# Patient Record
Sex: Female | Born: 1990 | Race: White | Marital: Married
Health system: Southern US, Community
[De-identification: ages and names within clinical notes are randomized; demographics above are authoritative.]

---

## 2012-10-02 ENCOUNTER — Other Ambulatory Visit: Payer: Self-pay | Admitting: Neurosurgery

## 2012-10-02 DIAGNOSIS — E348 Other specified endocrine disorders: Secondary | ICD-10-CM

## 2012-11-11 ENCOUNTER — Other Ambulatory Visit: Payer: Self-pay | Admitting: Neurosurgery

## 2012-11-11 ENCOUNTER — Ambulatory Visit
Admission: RE | Admit: 2012-11-11 | Discharge: 2012-11-11 | Disposition: A | Discharge status: Home / Self Care | Payer: BC Managed Care – PPO | Source: Ambulatory Visit | Attending: Neurosurgery | Admitting: Neurosurgery

## 2012-11-11 DIAGNOSIS — E348 Other specified endocrine disorders: Secondary | ICD-10-CM

## 2014-03-12 ENCOUNTER — Other Ambulatory Visit: Payer: Self-pay | Admitting: Gastroenterology

## 2014-03-12 DIAGNOSIS — R1013 Epigastric pain: Secondary | ICD-10-CM

## 2014-04-02 ENCOUNTER — Ambulatory Visit (HOSPITAL_COMMUNITY)
Admission: RE | Admit: 2014-04-02 | Discharge: 2014-04-02 | Disposition: A | Discharge status: Home / Self Care | Payer: BLUE CROSS/BLUE SHIELD | Source: Ambulatory Visit | Attending: Gastroenterology | Admitting: Gastroenterology

## 2014-04-02 ENCOUNTER — Encounter (HOSPITAL_COMMUNITY)
Admission: RE | Admit: 2014-04-02 | Discharge: 2014-04-02 | Disposition: A | Discharge status: Home / Self Care | Payer: BLUE CROSS/BLUE SHIELD | Source: Ambulatory Visit | Attending: Gastroenterology | Admitting: Gastroenterology

## 2014-04-02 DIAGNOSIS — R1013 Epigastric pain: Secondary | ICD-10-CM | POA: Insufficient documentation

## 2014-04-03 ENCOUNTER — Encounter (HOSPITAL_COMMUNITY)
Admission: RE | Admit: 2014-04-03 | Discharge: 2014-04-03 | Disposition: A | Discharge status: Home / Self Care | Payer: BLUE CROSS/BLUE SHIELD | Source: Ambulatory Visit | Attending: Gastroenterology | Admitting: Gastroenterology

## 2014-04-03 DIAGNOSIS — R1013 Epigastric pain: Secondary | ICD-10-CM | POA: Insufficient documentation

## 2014-04-03 MED ORDER — SINCALIDE 5 MCG IJ SOLR
0.0200 ug/kg | Freq: Once | INTRAMUSCULAR | Status: AC
  Administered 2014-04-03: 1.1 ug via INTRAVENOUS

## 2014-04-03 MED ORDER — TECHNETIUM TC 99M MEBROFENIN IV KIT: 4.9000 | PACK | Freq: Once | INTRAVENOUS | Status: AC | PRN

## 2014-04-03 MED ORDER — TECHNETIUM TC 99M MEBROFENIN IV KIT
4.9000 | PACK | Freq: Once | INTRAVENOUS | Status: AC | PRN
  Administered 2014-04-03: 5 via INTRAVENOUS

## 2014-08-06 ENCOUNTER — Emergency Department (HOSPITAL_BASED_OUTPATIENT_CLINIC_OR_DEPARTMENT_OTHER)
Admission: EM | Admit: 2014-08-06 | Discharge: 2014-08-06 | Disposition: A | Discharge status: Home / Self Care | Payer: BLUE CROSS/BLUE SHIELD | Attending: Emergency Medicine | Admitting: Emergency Medicine

## 2014-08-06 ENCOUNTER — Encounter (HOSPITAL_BASED_OUTPATIENT_CLINIC_OR_DEPARTMENT_OTHER): Payer: Self-pay | Admitting: *Deleted

## 2014-08-06 DIAGNOSIS — Y9289 Other specified places as the place of occurrence of the external cause: Secondary | ICD-10-CM | POA: Diagnosis not present

## 2014-08-06 DIAGNOSIS — L299 Pruritus, unspecified: Secondary | ICD-10-CM | POA: Diagnosis present

## 2014-08-06 DIAGNOSIS — Y9389 Activity, other specified: Secondary | ICD-10-CM | POA: Diagnosis not present

## 2014-08-06 DIAGNOSIS — T7840XA Allergy, unspecified, initial encounter: Secondary | ICD-10-CM | POA: Diagnosis not present

## 2014-08-06 DIAGNOSIS — X58XXXA Exposure to other specified factors, initial encounter: Secondary | ICD-10-CM | POA: Diagnosis not present

## 2014-08-06 DIAGNOSIS — Y998 Other external cause status: Secondary | ICD-10-CM | POA: Insufficient documentation

## 2014-08-06 MED ORDER — DEXAMETHASONE SODIUM PHOSPHATE 10 MG/ML IJ SOLN: 10.0000 mg | Freq: Once | INTRAMUSCULAR | Status: DC

## 2014-08-06 MED ORDER — DIPHENHYDRAMINE HCL 50 MG/ML IJ SOLN
25.0000 mg | Freq: Once | INTRAMUSCULAR | Status: DC
  Filled 2014-08-06: qty 1

## 2014-08-06 MED ORDER — DEXAMETHASONE SODIUM PHOSPHATE 10 MG/ML IJ SOLN
10.0000 mg | Freq: Once | INTRAMUSCULAR | Status: AC
  Administered 2014-08-06: 10 mg via INTRAMUSCULAR
  Filled 2014-08-06: qty 1

## 2014-08-06 MED ORDER — DIPHENHYDRAMINE HCL 50 MG/ML IJ SOLN: 25.0000 mg | Freq: Once | INTRAMUSCULAR | Status: DC

## 2014-08-06 NOTE — ED Provider Notes (Signed)
CSN: [id number]     Arrival date & time 08/06/14  1116 History   First MD Initiated Contact with Patient 08/06/14 1124     Chief Complaint  Patient presents with  . Allergic Reaction     (Consider location/radiation/quality/duration/timing/severity/associated sxs/prior Treatment) Patient is a 24 y.o. female presenting with allergic reaction. The history is provided by the patient.  Allergic Reaction Presenting symptoms: itching, rash and swelling   Presenting symptoms: no difficulty breathing, no difficulty swallowing and no wheezing   Severity:  Moderate Prior allergic episodes:  Food/nut allergies Relieved by:  None tried   Ms. Whitney Wolf is a 24 yo F PMH with allergic reaction to peanuts that presents today for an allergic reaction. She receives allergy shots from Dr. Madie Reno every few days. She also had a nasal coffee this morning and since then has developed itching, hives, tongue swelling and a funny feeling in her throat. She has not used her EpiPen yet. Her symptoms started about a half hour ago. She is breathing normally and has taken Allegra today. Her ears and eyes both feel full as well. She denies any chest pain, abdominal pain, or changes with bowel movements. She has had a similar episode about 3-4 years ago for which she received medications in the ED and resolved.  History reviewed. No pertinent past medical history. History reviewed. No pertinent past surgical history. No family history on file. History  Substance Use Topics  . Smoking status: Never Smoker   . Smokeless tobacco: Not on file  . Alcohol Use: No   OB History    No data available     Review of Systems  Constitutional: Negative for fever and chills.  HENT: Negative for trouble swallowing.   Respiratory: Negative for cough, choking, shortness of breath and wheezing.   Cardiovascular: Negative for chest pain.  Gastrointestinal: Negative for nausea, vomiting, abdominal pain, diarrhea and constipation.   Musculoskeletal: Negative for back pain.  Skin: Positive for itching and rash.  Neurological: Negative for weakness and numbness.      Allergies  Peanut-containing drug products  Home Medications   Prior to Admission medications   Not on File   BP 126/70 mmHg  Pulse 66  Temp(Src) 98.5 F (36.9 C) (Oral)  Resp 16  Wt 125 lb (56.7 kg)  SpO2 100%  LMP 07/23/2014 Physical Exam  Constitutional: She is oriented to person, place, and time. She appears well-developed and well-nourished.  HENT:  Head: Normocephalic and atraumatic.  Mouth/Throat: Uvula is midline, oropharynx is clear and moist and mucous membranes are normal.  Eyes: Conjunctivae and EOM are normal.  Neck: Normal range of motion.  Cardiovascular: Normal rate, regular rhythm, normal heart sounds and intact distal pulses.   No murmur heard. Pulmonary/Chest: Effort normal and breath sounds normal. No respiratory distress. She has no wheezes. She has no rales.  Abdominal: Soft. Bowel sounds are normal. She exhibits no distension. There is no tenderness. There is no rebound.  Musculoskeletal: Normal range of motion. She exhibits no edema.  Lymphadenopathy:    She has no cervical adenopathy.  Neurological: She is alert and oriented to person, place, and time.  Skin: Skin is warm. Rash noted.  Wheals presents in b/l axilla. Erythematous in nature.  Macular rashes across her chest and behind her ears.     ED Course  Procedures (including critical care time) Labs Review Labs Reviewed - No data to display  Imaging Review No results found.   EKG Interpretation None  Medications  diphenhydrAMINE (BENADRYL) injection 25 mg (25 mg Intramuscular Not Given 08/06/14 1210)  dexamethasone (DECADRON) injection 10 mg (10 mg Intramuscular Given 08/06/14 1209)     MDM   Final diagnoses:  Allergic reaction, initial encounter   Ms. Whitney Wolf is p/w allergic reaction. This is similar to previous episodes that she has  had. Will give IM decadron and benadryl.  Patient refused the Benadryl.   Symptoms much improved after steroid injection. Rashes have resolved that were across her chest, axilla and ears.  Advised her to call Dr. Zenaida Niece Winkle's office to inform them of the reaction today. Patient agrees with plan and discharge.   Myra Rude, MD PGY-2, [facility] 08/06/2014, 12:58 PM    Myra Rude, MD 08/06/14 1258  Gwyneth Sprout, MD 08/06/14 1459

## 2014-08-06 NOTE — ED Notes (Signed)
Hives on her face. Tongue feels like it is swelling. Speaking in complete sentences.

## 2014-08-06 NOTE — Discharge Instructions (Signed)
Food Allergy °A food allergy occurs from eating something you are sensitive to. Food allergies occur in all age groups. It may be passed to you from your parents (heredity).  °CAUSES  °Some common causes are cow's milk, seafood, eggs, nuts (including peanut butter), wheat, and soybeans. °SYMPTOMS  °Common problems are:  °· Swelling around the mouth. °· An itchy, red rash. °· Hives. °· Vomiting. °· Diarrhea. °Severe allergic reactions are life-threatening. This reaction is called anaphylaxis. It can cause the mouth and throat to swell. This makes it hard to breathe and swallow. In severe reactions, only a small amount of food may be fatal within seconds. °HOME CARE INSTRUCTIONS  °· If you are unsure what caused the reaction, keep a diary of foods eaten and symptoms that followed. Avoid foods that cause reactions. °· If hives or rash are present: °¨ Take medicines as directed. °¨ Use an over-the-counter antihistamine (diphenhydramine) to treat hives and itching as needed. °¨ Apply cold compresses to the skin or take baths in cool water. Avoid hot baths or showers. These will increase the redness and itching. °· If you are severely allergic: °¨ Hospitalization is often required following a severe reaction. °¨ Wear a medical alert bracelet or necklace that describes the allergy. °¨ Carry your anaphylaxis kit or epinephrine injection with you at all times. Both you and your family members should know how to use this. This can be lifesaving if you have a severe reaction. If epinephrine is used, it is important for you to seek immediate medical care or call your local emergency services (911 in [place] When the epinephrine wears off, it can be followed by a delayed reaction, which can be fatal. °· Replace your epinephrine immediately after use in case of another reaction. °· Ask your caregiver for instructions if you have not been taught how to use an epinephrine injection. °· Do not drive until medicines used to treat the  reaction have worn off, unless approved by your caregiver. °SEEK MEDICAL CARE IF:  °· You suspect a food allergy. Symptoms generally happen within 30 minutes of eating a food. °· Your symptoms have not gone away within 2 days. See your caregiver sooner if symptoms are getting worse. °· You develop new symptoms. °· You want to retest yourself with a food or drink you think causes an allergic reaction. Never do this if an anaphylactic reaction to that food or drink has happened before. °· There is a return of the symptoms which brought you to your caregiver. °SEEK IMMEDIATE MEDICAL CARE IF:  °· You have trouble breathing, are wheezing, or you have a tight feeling in your chest or throat. °· You have a swollen mouth, or you have hives, swelling, or itching all over your body. Use your epinephrine injection immediately. This is given into the outside of your thigh, deep into the muscle. Following use of the epinephrine injection, seek help right away. °Seek immediate medical care or call your local emergency services (911 in [place] °MAKE SURE YOU:  °· Understand these instructions. °· Will watch your condition. °· Will get help right away if you are not doing well or get worse. °Document Released: 02/04/2000 Document Revised: 05/01/2011 Document Reviewed: 09/26/2007 °ExitCare® Patient Information ©2015 [employer]. This information is not intended to replace advice given to you by your health care provider. Make sure you discuss any questions you have with your health care provider. ° °

## 2014-08-06 NOTE — ED Notes (Signed)
Pt texting and talking on her cell phone in nad. Reports she has had allergic reactions in the past, "and I have much more of a reaction in my tongue, that is much less today than it usually is." pt smiling and laughing with resp easy and reg in nad.

## 2015-06-03 ENCOUNTER — Other Ambulatory Visit: Payer: Self-pay | Admitting: Neurosurgery

## 2015-06-03 DIAGNOSIS — E348 Other specified endocrine disorders: Secondary | ICD-10-CM

## 2015-06-22 ENCOUNTER — Ambulatory Visit
Admission: RE | Admit: 2015-06-22 | Discharge: 2015-06-22 | Disposition: A | Discharge status: Home / Self Care | Payer: BLUE CROSS/BLUE SHIELD | Source: Ambulatory Visit | Attending: Neurosurgery | Admitting: Neurosurgery

## 2015-06-22 DIAGNOSIS — E348 Other specified endocrine disorders: Secondary | ICD-10-CM

## 2019-04-24 ENCOUNTER — Ambulatory Visit: Payer: BLUE CROSS/BLUE SHIELD | Attending: Internal Medicine

## 2019-04-24 ENCOUNTER — Ambulatory Visit: Payer: Self-pay | Attending: Family

## 2019-04-24 DIAGNOSIS — Z23 Encounter for immunization: Secondary | ICD-10-CM | POA: Insufficient documentation

## 2019-04-24 NOTE — Progress Notes (Signed)
   Covid-19 Vaccination Clinic  Name:  Kenady Doxtater    MRN: [id number] DOB: 10/15/1990  04/24/2019  Ms. Kontos was observed post Covid-19 immunization for 15 minutes without incident. She was provided with Vaccine Information Sheet and instruction to access the V-Safe system.   Ms. Frein was instructed to call 911 with any severe reactions post vaccine: Marland Kitchen Difficulty breathing  . Swelling of face and throat  . A fast heartbeat  . A bad rash all over body  . Dizziness and weakness   Immunizations Administered    Name Date Dose VIS Date Route   Moderna COVID-19 Vaccine 04/24/2019  4:26 PM 0.5 mL 01/21/2019 Intramuscular   Manufacturer: Moderna   Lot: [id number]   NDC: 32122-482-50

## 2019-05-27 ENCOUNTER — Ambulatory Visit: Payer: Self-pay | Attending: Family

## 2019-05-27 DIAGNOSIS — Z23 Encounter for immunization: Secondary | ICD-10-CM

## 2019-05-27 NOTE — Progress Notes (Signed)
   Covid-19 Vaccination Clinic  Name:  Alexandrina Fiorini    MRN: [id number] DOB: 1990/08/04  05/27/2019  Ms. Erker was observed post Covid-19 immunization for 15 minutes without incident. She was provided with Vaccine Information Sheet and instruction to access the V-Safe system.   Ms. Womac was instructed to call 911 with any severe reactions post vaccine: Marland Kitchen Difficulty breathing  . Swelling of face and throat  . A fast heartbeat  . A bad rash all over body  . Dizziness and weakness   Immunizations Administered    Name Date Dose VIS Date Route   Moderna COVID-19 Vaccine 05/27/2019  4:37 PM 0.5 mL 01/21/2019 Intramuscular   Manufacturer: Moderna   Lot: [id number]   NDC: 44514-604-79

## 2021-06-03 ENCOUNTER — Other Ambulatory Visit: Payer: Self-pay | Admitting: Obstetrics and Gynecology

## 2021-06-03 DIAGNOSIS — Z3169 Encounter for other general counseling and advice on procreation: Secondary | ICD-10-CM

## 2021-06-09 ENCOUNTER — Other Ambulatory Visit: Payer: Self-pay

## 2021-06-16 ENCOUNTER — Ambulatory Visit
Admission: RE | Admit: 2021-06-16 | Discharge: 2021-06-16 | Disposition: A | Discharge status: Home / Self Care | Payer: BC Managed Care – PPO | Source: Ambulatory Visit | Attending: Obstetrics and Gynecology | Admitting: Obstetrics and Gynecology

## 2021-06-16 DIAGNOSIS — Z3169 Encounter for other general counseling and advice on procreation: Secondary | ICD-10-CM

## 2021-10-06 ENCOUNTER — Other Ambulatory Visit: Payer: Self-pay

## 2021-10-06 ENCOUNTER — Encounter (HOSPITAL_COMMUNITY): Payer: Self-pay | Admitting: *Deleted

## 2021-10-06 ENCOUNTER — Inpatient Hospital Stay (HOSPITAL_COMMUNITY)
Admission: AD | Admit: 2021-10-06 | Discharge: 2021-10-06 | Disposition: A | Discharge status: Home / Self Care | Payer: BC Managed Care – PPO | Attending: Obstetrics and Gynecology | Admitting: Obstetrics and Gynecology

## 2021-10-06 DIAGNOSIS — R1032 Left lower quadrant pain: Secondary | ICD-10-CM | POA: Insufficient documentation

## 2021-10-06 DIAGNOSIS — Z3A17 17 weeks gestation of pregnancy: Secondary | ICD-10-CM | POA: Diagnosis not present

## 2021-10-06 DIAGNOSIS — R109 Unspecified abdominal pain: Secondary | ICD-10-CM | POA: Diagnosis not present

## 2021-10-06 DIAGNOSIS — O26892 Other specified pregnancy related conditions, second trimester: Secondary | ICD-10-CM
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
## 2021-12-28 ENCOUNTER — Ambulatory Visit: Payer: BC Managed Care – PPO | Attending: Obstetrics and Gynecology | Admitting: Physical Therapy

## 2021-12-28 DIAGNOSIS — R293 Abnormal posture: Secondary | ICD-10-CM | POA: Diagnosis not present

## 2021-12-28 NOTE — Therapy (Signed)
OUTPATIENT PHYSICAL THERAPY FEMALE PELVIC EVALUATION   Patient Name: Whitney Wolf MRN: [id number] DOB:[birth year], 31 y.o., female Today's Date: 12/28/2021   PT End of Session - 12/28/21 1532     Visit Number 1    Date for PT Re-Evaluation 03/30/22    Authorization Type BCBS    PT Start Time 1530    PT Stop Time 1610    PT Time Calculation (min) 40 min    Activity Tolerance Patient tolerated treatment well    Behavior During Therapy [facility] for tasks assessed/performed             No past medical history on file. No past surgical history on file. There are no problems to display for this patient.   PCP: Marcelle Overlie, MD  REFERRING PROVIDER: Lyn Henri, MD  REFERRING DIAG: R10.2 (ICD-10-CM) - Pelvic and perineal pain  THERAPY DIAG:  Abnormal posture  Rationale for Evaluation and Treatment: Rehabilitation  ONSET DATE: 28 weeks ago  SUBJECTIVE:                                                                                                                                                                                           SUBJECTIVE STATEMENT: Pt reports she doesn't have symptoms at this time, no pain at this time however wanted to get information for labor mechanics; pelvic floor PT and postpartum care contact.    PAIN:  Are you having pain? No   PRECAUTIONS: Other: pregnant   WEIGHT BEARING RESTRICTIONS: No  FALLS:  Has patient fallen in last 6 months? No  LIVING ENVIRONMENT: Lives with: lives with their family Lives in: House/apartment     PLOF: Independent  PATIENT GOALS: to learn about labor and delivery and improve pelvic stability for birthing baby  PERTINENT HISTORY:  [redacted] weeks pregnant Sexual abuse: No  BOWEL MOVEMENT: Pain with bowel movement: No Type of bowel movement:Type (Bristol Stool Scale) 3-4, Frequency daily-every other day, and Strain No Fully empty rectum: Yes:   Leakage: No Pads: No Fiber supplement:  No  URINATION: Pain with urination: No Fully empty bladder: Yes:   Stream: Strong Urgency: no  Frequency: slightly increased with pregnancy  Leakage:  none Pads: No  INTERCOURSE: Pain with intercourse:  not painful, denied dryness Ability to have vaginal penetration:  Yes:   Climax: not painful  Marinoff Scale: 0/3  PREGNANCY: Vaginal deliveries 0 Tearing No C-section deliveries 0 Currently pregnant Yes: 29 weeks  PROLAPSE: None   OBJECTIVE:   DIAGNOSTIC FINDINGS:    COGNITION: Overall cognitive status: Within functional limits for tasks assessed     SENSATION: Light touch: Appears  intact Proprioception: Appears intact  MUSCLE LENGTH: WFL    POSTURE: rounded shoulders, forward head, and anterior pelvic tilt   LUMBARAROM/PROM:  WFL  LOWER EXTREMITY ROM:  WFL LOWER EXTREMITY MMT:  Hips grossly 4+/5; knees and ankles 5/5 PALPATION:   General  no TTP                External Perineal Exam deferred                              Internal Pelvic Floor deferred  Patient confirms identification and approves PT to assess internal pelvic floor and treatment No  PELVIC MMT:   MMT eval  Vaginal   Internal Anal Sphincter   External Anal Sphincter   Puborectalis   Diastasis Recti   (Blank rows = not tested)        TONE: deferred  PROLAPSE: Deferred   TODAY'S TREATMENT:                                                                                                                               12/28/21 EVAL Examination completed, findings reviewed, pt educated on POC, HEP, and labor education handout. Pt had several questions about labor positions, pregnancy mobility, and postpartum care. All answered, pt educated with pelvic and spinal stretching and stability HEP given; labor handout given with positions. Pt educated that these positions/handout are not a substitute for medical recommendations and all medical recommendations should be followed  during pregnancy and labor and delivery. Also these are not to cause a shorter/less painful/no complications labor/delivery these are meant to be comfort positions for pt within medical recommendations and  agreement. Pt verbalized understanding. Pt motivated to participate in PT and agreeable to attempt recommendations.     PATIENT EDUCATION:  Education details: Air traffic controller Person educated: Patient Education method: Solicitor, Actor cues, Verbal cues, and Handouts Education comprehension: verbalized understanding and returned demonstration  HOME EXERCISE PROGRAM: [email or web address]  ASSESSMENT:  CLINICAL IMPRESSION: Patient is a 31 y.o. female  who was seen today for physical therapy evaluation and treatment for education and awareness for mobility during pregnancy and postpartum care. Pt is pregnant with first baby and wanted to learn about ways to say mobile during pregnancy and different positions for labor. Pt also educated she can return postpartum for additional needs post baby as well. Pt agreed. Pt reports she would like to return at least once to make sure she is doing stretches correctly and possibly bring husband. Pt would benefit from additional PT to further address deficits.     OBJECTIVE IMPAIRMENTS: improper body mechanics and postural dysfunction.   ACTIVITY LIMITATIONS:  not currently limited  PARTICIPATION LIMITATIONS:  activity levels  PERSONAL FACTORS: 1 comorbidity: currently pregnant  are also affecting patient's functional outcome.   REHAB POTENTIAL: Good  CLINICAL DECISION MAKING: Stable/uncomplicated  EVALUATION COMPLEXITY: Low   GOALS: Goals reviewed with  patient? Yes  SHORT TERM GOALS: Target date: 01/25/2022  Pt to be I with HEP.  Baseline: Goal status: INITIAL  LONG TERM GOALS: Target date:  03/30/22    Pt to be I with advanced HEP.  Baseline:  Goal status: INITIAL  2.  Pt to be I with pelvic relaxation and mobility without pain.   Baseline:  Goal status: INITIAL   PT FREQUENCY:  return 1-2 more visits for labor prep  PT DURATION:  1-2x sessions  PLANNED INTERVENTIONS: Therapeutic exercises, Therapeutic activity, Neuromuscular re-education, Patient/Family education, Self Care, Joint mobilization, Aquatic Therapy, Cryotherapy, Moist heat, scar mobilization, Taping, Biofeedback, and Manual therapy  PLAN FOR NEXT SESSION: labor prep, strengthen/stretching as needed for thoracic and hips  Otelia Sergeant, PT, DPT 11/08/235:08 PM

## 2022-02-20 NOTE — L&D Delivery Note (Signed)
Delivery Note  SVD viable female Apgars 9,9 over 2nd degree ML laceration.  Placenta delivered spontaneously intact with 3VC. Repair with 2-0 Chromic with good support and hemostasis noted.  R/V exam confirms.   Previous hx of left labial minora laceration when a child, and she was told we would repair it at delivery.  I discussed this with her and she gives informed consent. Left labial split edges cut with mayo scissors, then the edges reapproximated with interupted sutures of 3-0 chromic with good approximation and hemostasis.   Mother and baby to couplet care and are doing well.  EBL 157cc  Louretta Shorten, MD

## 2022-02-27 ENCOUNTER — Ambulatory Visit (INDEPENDENT_AMBULATORY_CARE_PROVIDER_SITE_OTHER): Payer: Self-pay | Admitting: Pediatrics

## 2022-02-27 DIAGNOSIS — Z7681 Expectant parent(s) prebirth pediatrician visit: Secondary | ICD-10-CM

## 2022-02-28 ENCOUNTER — Encounter: Payer: Self-pay | Admitting: Pediatrics

## 2022-02-28 DIAGNOSIS — Z7681 Expectant parent(s) prebirth pediatrician visit: Secondary | ICD-10-CM | POA: Insufficient documentation

## 2022-02-28 NOTE — Progress Notes (Signed)
Prenatal counseling for impending newborn done-- Z76.81  

## 2022-03-03 ENCOUNTER — Institutional Professional Consult (permissible substitution): Payer: Self-pay | Admitting: Pediatrics

## 2022-03-12 ENCOUNTER — Inpatient Hospital Stay (HOSPITAL_COMMUNITY)
Admission: AD | Admit: 2022-03-12 | Discharge: 2022-03-15 | DRG: 807 | Disposition: A | Discharge status: Home / Self Care | Payer: BC Managed Care – PPO | Attending: Obstetrics and Gynecology | Admitting: Obstetrics and Gynecology

## 2022-03-12 DIAGNOSIS — Z3A40 40 weeks gestation of pregnancy: Secondary | ICD-10-CM

## 2022-03-12 NOTE — MAU Note (Signed)
.  Whitney Wolf is a 32 y.o. at [redacted]w[redacted]d here in MAU reporting: ctx since 1500, currently every 3-5 mins apart. Pt denies VB, LOF , DFM, PIH s/s, complications in the pregnancy.  GBS neg SVE 1.5 cm last week  Onset of complaint: 1500 Pain score: 7/10 Vitals:   03/13/22 0000  BP: 132/74  Pulse: 67  SpO2: 100%     FHT:135 Lab orders placed from triage:

## 2022-03-13 ENCOUNTER — Encounter (HOSPITAL_COMMUNITY): Payer: Self-pay | Admitting: Obstetrics and Gynecology

## 2022-03-13 ENCOUNTER — Inpatient Hospital Stay (HOSPITAL_COMMUNITY): Payer: BC Managed Care – PPO | Admitting: Anesthesiology

## 2022-03-13 ENCOUNTER — Encounter (HOSPITAL_COMMUNITY): Payer: Self-pay

## 2022-03-13 ENCOUNTER — Other Ambulatory Visit: Payer: Self-pay

## 2022-03-13 ENCOUNTER — Inpatient Hospital Stay (HOSPITAL_COMMUNITY): Admit: 2022-03-13 | Payer: Self-pay

## 2022-03-13 DIAGNOSIS — Z3A40 40 weeks gestation of pregnancy: Secondary | ICD-10-CM | POA: Diagnosis not present

## 2022-03-13 DIAGNOSIS — O26893 Other specified pregnancy related conditions, third trimester: Secondary | ICD-10-CM | POA: Diagnosis present

## 2022-03-13 LAB — CBC
HCT: 33.7 % — ABNORMAL LOW (ref 36.0–46.0)
HCT: 38.9 % (ref 36.0–46.0)
Hemoglobin: 11.4 g/dL — ABNORMAL LOW (ref 12.0–15.0)
Hemoglobin: 13.5 g/dL (ref 12.0–15.0)
MCH: 31.5 pg (ref 26.0–34.0)
MCH: 32.1 pg (ref 26.0–34.0)
MCHC: 33.8 g/dL (ref 30.0–36.0)
MCHC: 34.7 g/dL (ref 30.0–36.0)
MCV: 92.6 fL (ref 80.0–100.0)
MCV: 93.1 fL (ref 80.0–100.0)
Platelets: 169 10*3/uL (ref 150–400)
Platelets: 236 10*3/uL (ref 150–400)
RBC: 3.62 MIL/uL — ABNORMAL LOW (ref 3.87–5.11)
RBC: 4.2 MIL/uL (ref 3.87–5.11)
RDW: 13.2 % (ref 11.5–15.5)
RDW: 13.6 % (ref 11.5–15.5)
WBC: 13.4 10*3/uL — ABNORMAL HIGH (ref 4.0–10.5)
WBC: 16.3 10*3/uL — ABNORMAL HIGH (ref 4.0–10.5)
nRBC: 0 % (ref 0.0–0.2)
nRBC: 0 % (ref 0.0–0.2)

## 2022-03-13 LAB — TYPE AND SCREEN
ABO/RH(D): O POS
Antibody Screen: NEGATIVE

## 2022-03-13 LAB — RPR: RPR Ser Ql: NONREACTIVE

## 2022-03-13 MED ORDER — FLEET ENEMA 7-19 GM/118ML RE ENEM: 1.0000 | ENEMA | RECTAL | Status: DC | PRN

## 2022-03-13 MED ORDER — FENTANYL-BUPIVACAINE-NACL 0.5-0.125-0.9 MG/250ML-% EP SOLN
EPIDURAL | Status: DC | PRN
  Administered 2022-03-13: 12 mL/h via EPIDURAL

## 2022-03-13 MED ORDER — PHENYLEPHRINE HCL (PRESSORS) 10 MG/ML IV SOLN
INTRAVENOUS | Status: DC | PRN
  Administered 2022-03-13: 80 ug via INTRAVENOUS
  Administered 2022-03-13: 160 ug via INTRAVENOUS

## 2022-03-13 MED ORDER — SIMETHICONE 80 MG PO CHEW: 80.0000 mg | CHEWABLE_TABLET | ORAL | Status: DC | PRN

## 2022-03-13 MED ORDER — OXYCODONE-ACETAMINOPHEN 5-325 MG PO TABS: 2.0000 | ORAL_TABLET | ORAL | Status: DC | PRN

## 2022-03-13 MED ORDER — OXYCODONE-ACETAMINOPHEN 5-325 MG PO TABS: 1.0000 | ORAL_TABLET | ORAL | Status: DC | PRN

## 2022-03-13 MED ORDER — PHENYLEPHRINE 80 MCG/ML (10ML) SYRINGE FOR IV PUSH (FOR BLOOD PRESSURE SUPPORT): 80.0000 ug | PREFILLED_SYRINGE | INTRAVENOUS | Status: DC | PRN

## 2022-03-13 MED ORDER — FENTANYL CITRATE (PF) 100 MCG/2ML IJ SOLN
INTRAMUSCULAR | Status: DC | PRN
  Administered 2022-03-13: 100 ug via EPIDURAL

## 2022-03-13 MED ORDER — ACETAMINOPHEN 325 MG PO TABS
650.0000 mg | ORAL_TABLET | ORAL | Status: DC | PRN
  Administered 2022-03-13 – 2022-03-14 (×6): 650 mg via ORAL
  Filled 2022-03-13 (×6): qty 2

## 2022-03-13 MED ORDER — BENZOCAINE-MENTHOL 20-0.5 % EX AERO
1.0000 | INHALATION_SPRAY | CUTANEOUS | Status: DC | PRN
  Administered 2022-03-13: 1 via TOPICAL
  Filled 2022-03-13: qty 56

## 2022-03-13 MED ORDER — TETANUS-DIPHTH-ACELL PERTUSSIS 5-2.5-18.5 LF-MCG/0.5 IM SUSY: 0.5000 mL | PREFILLED_SYRINGE | Freq: Once | INTRAMUSCULAR | Status: DC

## 2022-03-13 MED ORDER — FENTANYL CITRATE (PF) 100 MCG/2ML IJ SOLN
50.0000 ug | INTRAMUSCULAR | Status: DC | PRN
  Administered 2022-03-13 (×2): 100 ug via INTRAVENOUS
  Filled 2022-03-13 (×2): qty 2

## 2022-03-13 MED ORDER — LACTATED RINGERS IV SOLN: 500.0000 mL | Freq: Once | INTRAVENOUS | Status: DC

## 2022-03-13 MED ORDER — WITCH HAZEL-GLYCERIN EX PADS
1.0000 | MEDICATED_PAD | CUTANEOUS | Status: DC | PRN
  Administered 2022-03-13 – 2022-03-15 (×2): 1 via TOPICAL

## 2022-03-13 MED ORDER — DIPHENHYDRAMINE HCL 25 MG PO CAPS: 25.0000 mg | ORAL_CAPSULE | Freq: Four times a day (QID) | ORAL | Status: DC | PRN

## 2022-03-13 MED ORDER — IBUPROFEN 600 MG PO TABS
600.0000 mg | ORAL_TABLET | Freq: Four times a day (QID) | ORAL | Status: DC
  Administered 2022-03-13 – 2022-03-15 (×9): 600 mg via ORAL
  Filled 2022-03-13 (×9): qty 1

## 2022-03-13 MED ORDER — SALINE SPRAY 0.65 % NA SOLN
1.0000 | NASAL | Status: DC | PRN
  Filled 2022-03-13 (×2): qty 44

## 2022-03-13 MED ORDER — BUPIVACAINE HCL (PF) 0.25 % IJ SOLN
INTRAMUSCULAR | Status: DC | PRN
  Administered 2022-03-13: 1.4 mL via INTRATHECAL

## 2022-03-13 MED ORDER — LACTATED RINGERS IV SOLN: INTRAVENOUS | Status: DC

## 2022-03-13 MED ORDER — PRENATAL MULTIVITAMIN CH
1.0000 | ORAL_TABLET | Freq: Every day | ORAL | Status: DC
  Filled 2022-03-13 (×2): qty 1

## 2022-03-13 MED ORDER — ONDANSETRON HCL 4 MG PO TABS: 4.0000 mg | ORAL_TABLET | ORAL | Status: DC | PRN

## 2022-03-13 MED ORDER — ZOLPIDEM TARTRATE 5 MG PO TABS: 5.0000 mg | ORAL_TABLET | Freq: Every evening | ORAL | Status: DC | PRN

## 2022-03-13 MED ORDER — PHENYLEPHRINE 80 MCG/ML (10ML) SYRINGE FOR IV PUSH (FOR BLOOD PRESSURE SUPPORT)
80.0000 ug | PREFILLED_SYRINGE | INTRAVENOUS | Status: DC | PRN
  Filled 2022-03-13: qty 10

## 2022-03-13 MED ORDER — FENTANYL-BUPIVACAINE-NACL 0.5-0.125-0.9 MG/250ML-% EP SOLN
12.0000 mL/h | EPIDURAL | Status: DC | PRN
  Filled 2022-03-13: qty 250

## 2022-03-13 MED ORDER — ONDANSETRON HCL 4 MG/2ML IJ SOLN: 4.0000 mg | INTRAMUSCULAR | Status: DC | PRN

## 2022-03-13 MED ORDER — ONDANSETRON HCL 4 MG/2ML IJ SOLN: 4.0000 mg | Freq: Four times a day (QID) | INTRAMUSCULAR | Status: DC | PRN

## 2022-03-13 MED ORDER — LIDOCAINE HCL (PF) 1 % IJ SOLN: 30.0000 mL | INTRAMUSCULAR | Status: DC | PRN

## 2022-03-13 MED ORDER — EPHEDRINE 5 MG/ML INJ: 10.0000 mg | INTRAVENOUS | Status: DC | PRN

## 2022-03-13 MED ORDER — MEASLES, MUMPS & RUBELLA VAC IJ SOLR: 0.5000 mL | Freq: Once | INTRAMUSCULAR | Status: DC

## 2022-03-13 MED ORDER — SOD CITRATE-CITRIC ACID 500-334 MG/5ML PO SOLN
30.0000 mL | ORAL | Status: DC | PRN
  Administered 2022-03-13: 30 mL via ORAL
  Filled 2022-03-13: qty 30

## 2022-03-13 MED ORDER — LACTATED RINGERS IV SOLN
500.0000 mL | INTRAVENOUS | Status: DC | PRN
  Administered 2022-03-13: 1000 mL via INTRAVENOUS

## 2022-03-13 MED ORDER — OXYTOCIN-SODIUM CHLORIDE 30-0.9 UT/500ML-% IV SOLN
2.5000 [IU]/h | INTRAVENOUS | Status: DC
  Filled 2022-03-13: qty 500

## 2022-03-13 MED ORDER — ACETAMINOPHEN 325 MG PO TABS: 650.0000 mg | ORAL_TABLET | ORAL | Status: DC | PRN

## 2022-03-13 MED ORDER — MEDROXYPROGESTERONE ACETATE 150 MG/ML IM SUSP: 150.0000 mg | INTRAMUSCULAR | Status: DC | PRN

## 2022-03-13 MED ORDER — DIBUCAINE (PERIANAL) 1 % EX OINT: 1.0000 | TOPICAL_OINTMENT | CUTANEOUS | Status: DC | PRN

## 2022-03-13 MED ORDER — DIPHENHYDRAMINE HCL 50 MG/ML IJ SOLN: 12.5000 mg | INTRAMUSCULAR | Status: DC | PRN

## 2022-03-13 MED ORDER — COCONUT OIL OIL
1.0000 | TOPICAL_OIL | Status: DC | PRN
  Administered 2022-03-13: 1 via TOPICAL

## 2022-03-13 MED ORDER — OXYTOCIN BOLUS FROM INFUSION
333.0000 mL | Freq: Once | INTRAVENOUS | Status: AC
  Administered 2022-03-13: 333 mL via INTRAVENOUS

## 2022-03-13 MED ORDER — SENNOSIDES-DOCUSATE SODIUM 8.6-50 MG PO TABS
2.0000 | ORAL_TABLET | Freq: Every day | ORAL | Status: DC
  Filled 2022-03-13 (×2): qty 2

## 2022-03-13 NOTE — Anesthesia Postprocedure Evaluation (Signed)
Anesthesia Post Note  Patient: Whitney Wolf  Procedure(s) Performed: AN AD [place]     Patient location during evaluation: Mother Baby Anesthesia Type: Epidural Level of consciousness: awake and alert Pain management: pain level controlled Vital Signs Assessment: post-procedure vital signs reviewed and stable Respiratory status: spontaneous breathing, nonlabored ventilation and respiratory function stable Cardiovascular status: stable Postop Assessment: no headache, no backache and epidural receding Anesthetic complications: no   No notable events documented.  Last Vitals:  Vitals:   03/13/22 1028 03/13/22 1420  BP: 111/66 113/71  Pulse: (!) 58 71  Resp: 18 18  Temp: 36.6 C 36.4 C  SpO2: 99% 99%    Last Pain:  Vitals:   03/13/22 1619  TempSrc:   PainSc: 3    Pain Goal:                   [name]

## 2022-03-13 NOTE — Lactation Note (Signed)
This note was copied from a baby's chart. Lactation Consultation Note  Patient Name: Whitney Wolf [id number] Date: 03/13/2022 Reason for consult: Initial assessment;Primapara;1st time breastfeeding;Term;Other (Comment) (DAT (+)) Age:32 hours  Visited with family of 32 hours old FT female; Ms. Cashion is a P1 and reported (+) breast changes during the pregnancy. FOB doing STS when entered the room, praised family for their efforts. Assisted with hand expression (colostrum easily expressed), latching & positioning, baby fed for 10 minutes (see LATCH score); Ms. Escalante asked for assistance with repositioning due to baby getting shallow. Reviewed normal newborn behavior, feeding cues, cluster feeding and newborn jaundice.   Maternal Data Has patient been taught Hand Expression?: Yes Does the patient have breastfeeding experience prior to this delivery?: No  Feeding Mother's Current Feeding Choice: Breast Milk  LATCH Score Latch: Repeated attempts needed to sustain latch, nipple held in mouth throughout feeding, stimulation needed to elicit sucking reflex. (had to re-latch multiple times due to baby loosing depth and falling asleep at the breast)  Audible Swallowing: A few with stimulation  Type of Nipple: Everted at rest and after stimulation  Comfort (Breast/Nipple): Soft / non-tender  Hold (Positioning): Assistance needed to correctly position infant at breast and maintain latch.  LATCH Score: 7  Lactation Tools Discussed/Used Tools: Flanges;Pump Flange Size: 24 Breast pump type: Manual Pump Education: Setup, frequency, and cleaning;Milk Storage Reason for Pumping: mother's request Pumping frequency: PNR Pumped volume:  (droplets)  Interventions Interventions: Breast feeding basics reviewed;Breast massage;Hand express;Hand pump;LC Services brochure;Assisted with latch;Skin to skin;Breast compression;Adjust position  Plan of care Encouraged putting baby to breast on feeding cues  +8 times/24 hours  Breast massage and hand expression were also encouraged prior latching She'll use her hand pump PRN, will spoon feed baby any amount of EBM she might get  FOB present and very supportive. All questions and concerns answered, family to contact [facility] services PRN.  Discharge Pump: Personal (Medela DEBP at home)  Consult Status Consult Status: Follow-up Date: 03/13/22 Follow-up type: In-patient   Whitney Wolf 03/13/2022, 1:16 PM

## 2022-03-13 NOTE — Progress Notes (Signed)
Post Partum Day 0 Subjective: no complaints, up ad lib, voiding, and tolerating PO  Objective: Blood pressure (!) 104/59, pulse 66, temperature 98.2 F (36.8 C), temperature source Oral, resp. rate 18, height 5\' 9"  (1.753 m), weight 76.9 kg, SpO2 99 %, unknown if currently breastfeeding.  Physical Exam:  General: alert, cooperative, and appears stated age Lochia: appropriate Uterine Fundus: firm Incision: healing well, no significant drainage DVT Evaluation: No evidence of DVT seen on physical exam. Negative Homan's sign. No cords or calf tenderness.  Recent Labs    03/13/22 0010 03/13/22 0621  HGB 13.5 11.4*  HCT 38.9 33.7*    Assessment/Plan: Plan for discharge tomorrow, Breastfeeding, and Circumcision prior to discharge   LOS: 0 days   Linda Hedges, DO 03/13/2022, 8:44 AM

## 2022-03-13 NOTE — Anesthesia Preprocedure Evaluation (Addendum)
Anesthesia Evaluation  Patient identified by MRN, date of birth, ID band Patient awake    Reviewed: Allergy & Precautions, Patient's Chart, lab work & pertinent test results  Airway Mallampati: II  TM Distance: >3 FB Neck ROM: Full    Dental no notable dental hx.    Pulmonary neg pulmonary ROS   Pulmonary exam normal breath sounds clear to auscultation       Cardiovascular negative cardio ROS Normal cardiovascular exam Rhythm:Regular Rate:Normal     Neuro/Psych negative neurological ROS  negative psych ROS   GI/Hepatic negative GI ROS, Neg liver ROS,,,  Endo/Other  negative endocrine ROS    Renal/GU negative Renal ROS  negative genitourinary   Musculoskeletal negative musculoskeletal ROS (+)    Abdominal   Peds negative pediatric ROS (+)  Hematology negative hematology ROS (+) Hb 13.5, plt 236   Anesthesia Other Findings   Reproductive/Obstetrics (+) Pregnancy                             Anesthesia Physical Anesthesia Plan  ASA: 2  Anesthesia Plan: Combined Spinal and Epidural   Post-op Pain Management:    Induction:   PONV Risk Score and Plan: 2  Airway Management Planned: Natural Airway  Additional Equipment: None  Intra-op Plan:   Post-operative Plan:   Informed Consent: I have reviewed the patients History and Physical, chart, labs and discussed the procedure including the risks, benefits and alternatives for the proposed anesthesia with the patient or authorized representative who has indicated his/her understanding and acceptance.       Plan Discussed with:   Anesthesia Plan Comments: (8cm on arrival from MAU)       Anesthesia Quick Evaluation

## 2022-03-13 NOTE — MAU Note (Signed)
Pt transferred to LD via stretcher-IV patent-pt in stable condition for transfer-care relinquished to [employer]

## 2022-03-13 NOTE — H&P (Signed)
Whitney Wolf is a 32 y.o. female presenting for labor.  Now with spinal/epidural in L&D and comfortable.  Pregnancy uncomplicated.  GBS neg. OB History     Gravida  1   Para  0   Term  0   Preterm  0   AB  0   Living         SAB  0   IAB  0   Ectopic  0   Multiple      Live Births             No past medical history on file. No past surgical history on file. Family History: family history is not on file. Social History:  reports that she does not drink alcohol and does not use drugs. No history on file for tobacco use.     Maternal Diabetes: No Genetic Screening: Normal Maternal Ultrasounds/Referrals: Normal Fetal Ultrasounds or other Referrals:  None Maternal Substance Abuse:  No Significant Maternal Medications:  None Significant Maternal Lab Results:  Group B Strep negative Number of Prenatal Visits:greater than 3 verified prenatal visits Other Comments:  None  Review of Systems History Dilation: 10 Effacement (%): 100 Station: Plus 2 Exam by:: Dr. Corinna Capra Blood pressure 134/63, pulse 69, temperature 97.9 F (36.6 C), temperature source Oral, height 5\' 9"  (1.753 m), weight 76.9 kg, SpO2 100 %. Exam Physical Exam  Vitals and nursing note reviewed. Exam conducted with a chaperone present.  Constitutional:      Appearance: Normal appearance.  HENT:     Head: Normocephalic.  Eyes:     Pupils: Pupils are equal, round, and reactive to light.  Cardiovascular:     Rate and Rhythm: Normal rate and regular rhythm.     Pulses: Normal pulses.  Abdominal:     General: Abdomen is Gravid, nontender Neurological:     Mental Status: She is alert. Prenatal labs: ABO, Rh: --/--/O POS (01/22 0010) Antibody: NEG (01/22 0010) Rubella:   RPR:    HBsAg:    HIV:    GBS:     Assessment/Plan: IUP at 40 weeks Active labor.  Anticipate SVD   Luz Lex 03/13/2022, 2:20 AM

## 2022-03-13 NOTE — Anesthesia Procedure Notes (Signed)
Epidural Patient location during procedure: OB Start time: 03/13/2022 12:53 AM End time: 03/13/2022 1:07 AM  Staffing Anesthesiologist: Pervis Hocking, DO Performed: anesthesiologist   Preanesthetic Checklist Completed: patient identified, IV checked, risks and benefits discussed, monitors and equipment checked, pre-op evaluation and timeout performed  Epidural Patient position: sitting Prep: DuraPrep and site prepped and draped Patient monitoring: continuous pulse ox, blood pressure, heart rate and cardiac monitor Approach: midline Location: L3-L4 Injection technique: LOR air  Needle:  Needle type: Tuohy  Needle gauge: 17 G Needle length: 9 cm Needle insertion depth: 4.5 cm Catheter type: closed end flexible Catheter size: 19 Gauge Catheter at skin depth: 10 cm Test dose: negative  Assessment Sensory level: T8 Events: blood not aspirated, no cerebrospinal fluid, injection not painful, no injection resistance, no paresthesia and negative IV test  Additional Notes Patient identified. Risks/Benefits/Options discussed with patient including but not limited to bleeding, infection, nerve damage, paralysis, failed block, incomplete pain control, headache, blood pressure changes, nausea, vomiting, reactions to medication both or allergic, itching and postpartum back pain. Confirmed with bedside nurse the patient's most recent platelet count. Confirmed with patient that they are not currently taking any anticoagulation, have any bleeding history or any family history of bleeding disorders. Patient expressed understanding and wished to proceed. All questions were answered. Sterile technique was used throughout the entire procedure. Please see nursing notes for vital signs. Test dose was given through epidural catheter and negative prior to continuing to dose epidural or start infusion. Warning signs of high block given to the patient including shortness of breath, tingling/numbness in  hands, complete motor block, or any concerning symptoms with instructions to call for help. Patient was given instructions on fall risk and not to get out of bed. All questions and concerns addressed with instructions to call with any issues or inadequate analgesia.    8cm on arrival from MAU, CSE performed with 24G sprotte through tuohy uneventfully, clear CSF. Reason for block:procedure for pain

## 2022-03-14 NOTE — Progress Notes (Signed)
Post Partum Day 1 Subjective: Having some discomfort from her laceration repair Otherwise feeling well, ambulating, tolerating PO.  Objective:    03/14/2022    6:13 AM 03/13/2022    9:00 PM 03/13/2022    6:19 PM  Vitals with BMI  Systolic [phone]  Diastolic 66 60 70  Pulse 75 76 73   Physical Exam:  General: alert, cooperative, and appears stated age Lochia: appropriate Uterine Fundus: firm DVT Evaluation: No evidence of DVT seen on physical exam. No calf tenderness  Recent Labs    03/13/22 0010 03/13/22 0621  HGB 13.5 11.4*  HCT 38.9 33.7*   Assessment/Plan: Continue current care - meeting postpartum milestones Remains sore from stitches. Working on pain control today. Desires circ - will perform today. Anticipate dc tomorrow.   LOS: 1 day   Charlotta Newton, MD 03/14/2022, 8:20 AM

## 2022-03-14 NOTE — Lactation Note (Signed)
This note was copied from a baby's chart. Lactation Consultation Note  Patient Name: Whitney Wolf [id number] Date: 03/14/2022 Reason for consult: Follow-up assessment;1st time breastfeeding Age:32 hours  P1, 4 voids and 6 stools in the last 24 hours.  Stools transitioning.  Mother requested assistance because her nipples are tender.   Mother latched baby in cross cradle hold with ease.  She can hand express good flow.  Intermittent swallows observed. Assisted with laid back position to improve comfort. For soreness suggest mother apply ebm or coconut oil while wearing shells and alternate with comfort gels. Suggest calling for help as needed.   Maternal Data Has patient been taught Hand Expression?: Yes Does the patient have breastfeeding experience prior to this delivery?: No  Feeding Mother's Current Feeding Choice: Breast Milk  LATCH Score Latch: Grasps breast easily, tongue down, lips flanged, rhythmical sucking.  Audible Swallowing: A few with stimulation  Type of Nipple: Everted at rest and after stimulation  Comfort (Breast/Nipple): Filling, red/small blisters or bruises, mild/mod discomfort  Hold (Positioning): Assistance needed to correctly position infant at breast and maintain latch.  LATCH Score: 7   Lactation Tools Discussed/Used Tools: Coconut oil;Comfort gels;Shells  Interventions Interventions: Breast feeding basics reviewed;Assisted with latch;Skin to skin;Hand express;Position options;Coconut oil;Shells;Comfort gels;Education  Discharge Pump: Personal;DEBP  Consult Status Consult Status: Follow-up Date: 03/15/22 Follow-up type: In-patient    Vivianne Master [facility] 03/14/2022, 2:40 PM

## 2022-03-15 NOTE — Discharge Summary (Signed)
Postpartum Discharge Summary  Date of Service updated 03/15/22     Patient Name: Whitney Wolf DOB: October 06, 1990 MRN: [id number]  Date of admission: 03/12/2022 Delivery date:03/13/2022  Delivering provider: Louretta Shorten  Date of discharge: 03/15/2022  Admitting diagnosis: Normal labor [O80, Z37.9] Intrauterine pregnancy: [redacted]w[redacted]d     Secondary diagnosis:  Principal Problem:   Normal labor  Additional problems: None    Discharge diagnosis: Term Pregnancy Delivered                                              Post partum procedures: None Augmentation: AROM Complications: None  Hospital course: Onset of Labor With Vaginal Delivery      32 y.o. yo G1P1001 at [redacted]w[redacted]d was admitted in Active Labor on 03/12/2022. Labor course was complicated bynothing  Membrane Rupture Time/Date: 1:55 AM ,03/13/2022   Delivery Method:Vaginal, Spontaneous  Episiotomy: None  Lacerations:  2nd degree;Perineal  Patient had a postpartum course complicated by nothing.  She is ambulating, tolerating a regular diet, passing flatus, and urinating well. Patient is discharged home in stable condition on 03/15/22.  Newborn Data: Birth date:03/13/2022  Birth time:3:24 AM  Gender:Female  Living status:Living  Apgars:9 ,9  Weight:3430 g   Magnesium Sulfate received: No BMZ received: No Rhophylac:N/A MMR:N/A T-DaP:Given prenatally Flu: N/A Transfusion:No  Physical exam  Vitals:   03/14/22 0613 03/14/22 1622 03/14/22 1959 03/15/22 0617  BP: 107/66 122/74 119/70 113/71  Pulse: 75 69 71 68  Resp: 16 17 16 16   Temp: 98.7 F (37.1 C) 98.1 F (36.7 C) 98.1 F (36.7 C) 97.6 F (36.4 C)  TempSrc: Oral Oral Oral Oral  SpO2: 98% 97% 98% 98%  Weight:      Height:       General: alert Lochia: appropriate Uterine Fundus: firm Incision: N/A DVT Evaluation: No evidence of DVT seen on physical exam. Labs: Lab Results  Component Value Date   WBC 16.3 (H) 03/13/2022   HGB 11.4 (L) 03/13/2022   HCT 33.7 (L)  03/13/2022   MCV 93.1 03/13/2022   PLT 169 03/13/2022       No data to display         [place] Score:    03/13/2022    3:20 PM  Edinburgh Postnatal Depression Scale Screening Tool  I have been able to laugh and see the funny side of things. 0  I have looked forward with enjoyment to things. 0  I have blamed myself unnecessarily when things went wrong. 1  I have been anxious or worried for no good reason. 2  I have felt scared or panicky for no good reason. 1  Things have been getting on top of me. 1  I have been so unhappy that I have had difficulty sleeping. 0  I have felt sad or miserable. 0  I have been so unhappy that I have been crying. 0  The thought of harming myself has occurred to me. 0  Edinburgh Postnatal Depression Scale Total 5      After visit meds:  Allergies as of 03/15/2022       Reactions   Peanut-containing Drug Products         Medication List    You have not been prescribed any medications.      Discharge home in stable condition Infant Feeding: Bottle and Breast Infant Disposition:home with  mother Discharge instruction: per After Visit Summary and Postpartum booklet. Activity: Advance as tolerated. Pelvic rest for 6 weeks.  Diet: routine diet Anticipated Birth Control: Unsure Postpartum Appointment:6 weeks Additional Postpartum F/U:  None Future Appointments:No future appointments. Follow up Visit:      03/15/2022 Tyson Dense, MD

## 2022-03-22 ENCOUNTER — Telehealth (HOSPITAL_COMMUNITY): Payer: Self-pay | Admitting: *Deleted

## 2022-03-22 NOTE — Telephone Encounter (Signed)
Attempted Hospital Discharge Follow-Up Call.  Left voice mail requesting that patient return RN's phone call if patient has any concerns or questions regarding herself or her baby.  

## 2022-09-07 ENCOUNTER — Other Ambulatory Visit: Payer: Self-pay | Admitting: Obstetrics and Gynecology

## 2022-09-07 DIAGNOSIS — G930 Cerebral cysts: Secondary | ICD-10-CM

## 2022-10-04 ENCOUNTER — Other Ambulatory Visit: Payer: BC Managed Care – PPO

## 2022-11-15 ENCOUNTER — Ambulatory Visit
Admission: RE | Admit: 2022-11-15 | Discharge: 2022-11-15 | Disposition: A | Discharge status: Home / Self Care | Payer: BC Managed Care – PPO | Source: Ambulatory Visit | Attending: Obstetrics and Gynecology | Admitting: Obstetrics and Gynecology

## 2022-11-15 ENCOUNTER — Other Ambulatory Visit: Payer: Self-pay | Admitting: Obstetrics and Gynecology

## 2022-11-15 DIAGNOSIS — G930 Cerebral cysts: Secondary | ICD-10-CM

## 2023-04-25 IMAGING — RF DG HYSTEROGRAM
1 series · 6 of 6 positions shown · IV contrast (omnipaque)
Comparison: None.

CLINICAL DATA: Desires fertility.

EXAM:
HYSTEROSALPINGOGRAM
TECHNIQUE: Following cleansing of the cervix and vagina with Betadine solution,
a hysterosalpingogram was performed using a 5-French
hysterosalpingogram catheter and Omnipaque 300 contrast. The patient
tolerated the examination without difficulty.

[Series 1: one shot · 6 of 6 slices shown]
[im 1/6]
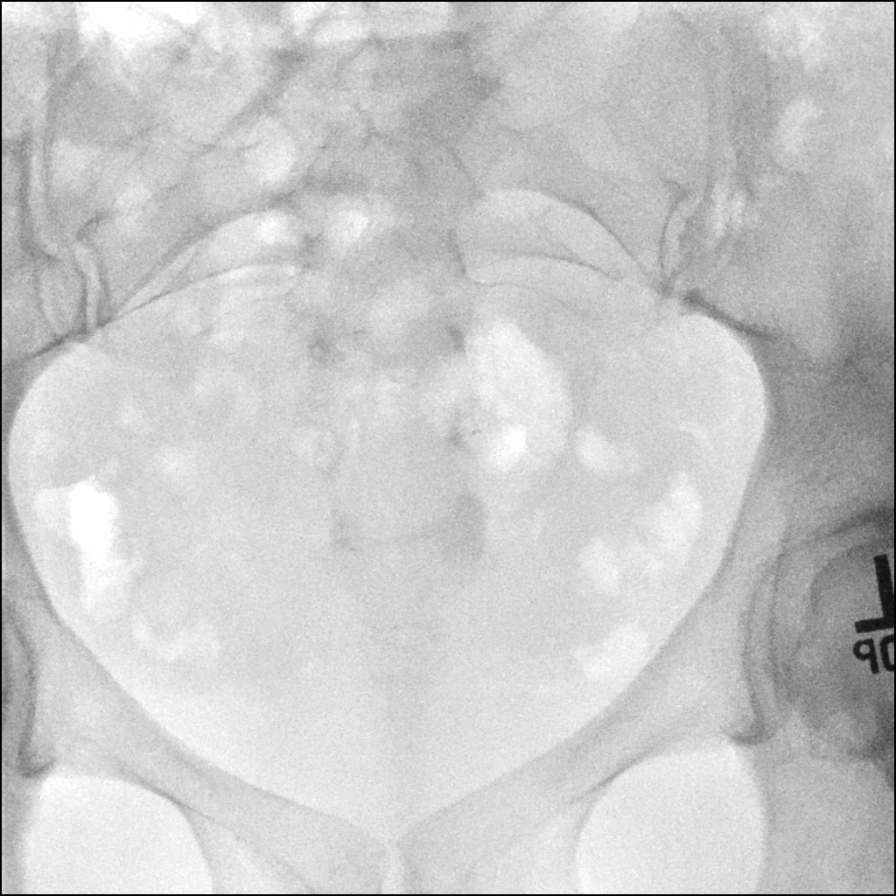
[im 2/6]
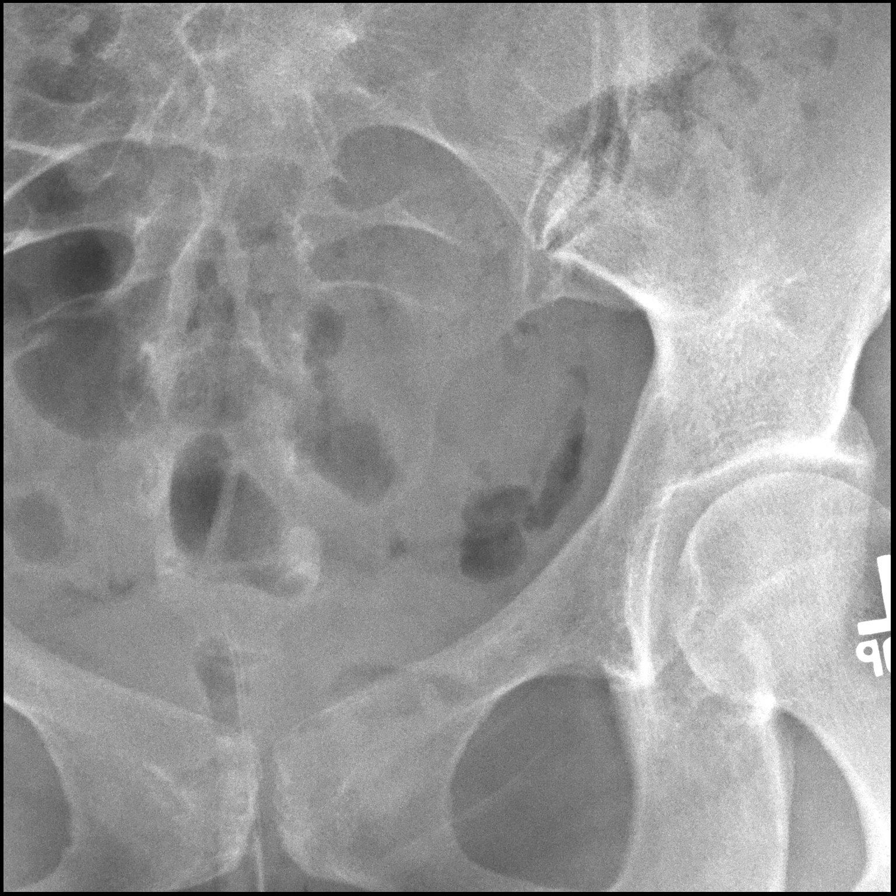
[im 3/6]
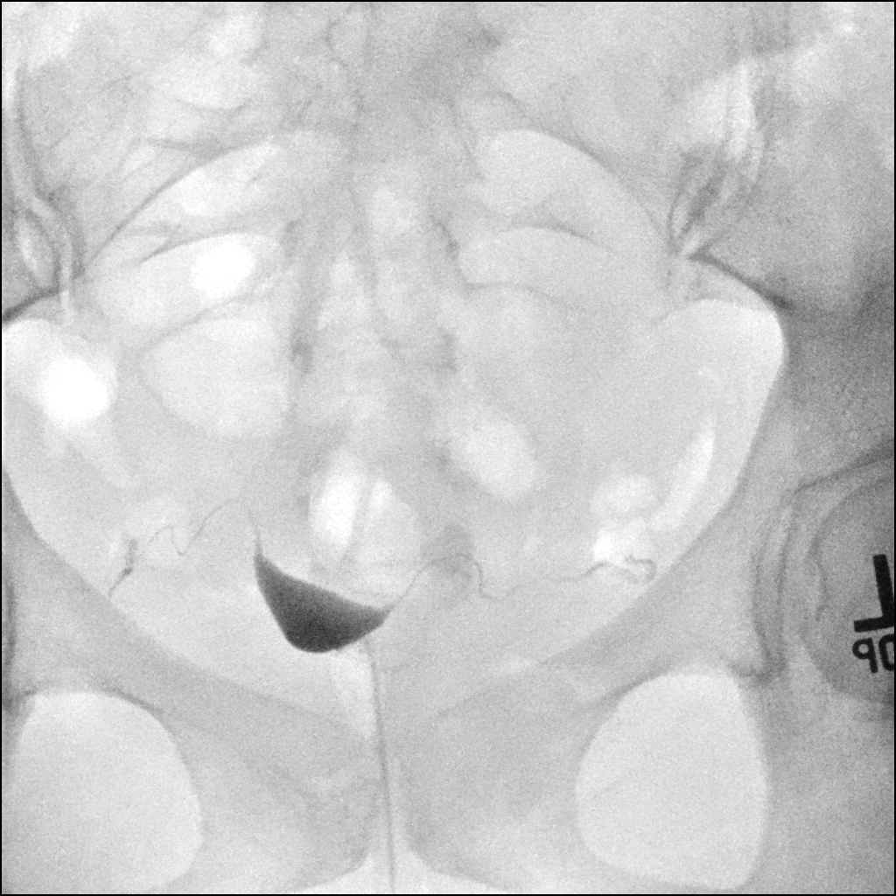
[im 4/6]
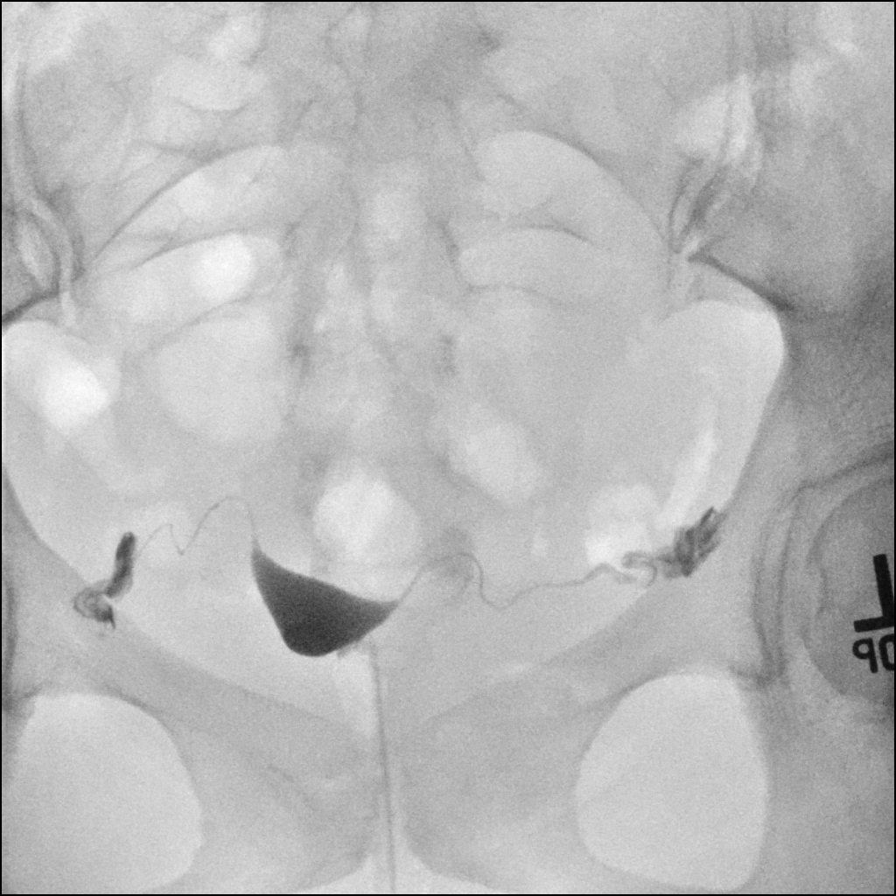
[im 5/6]
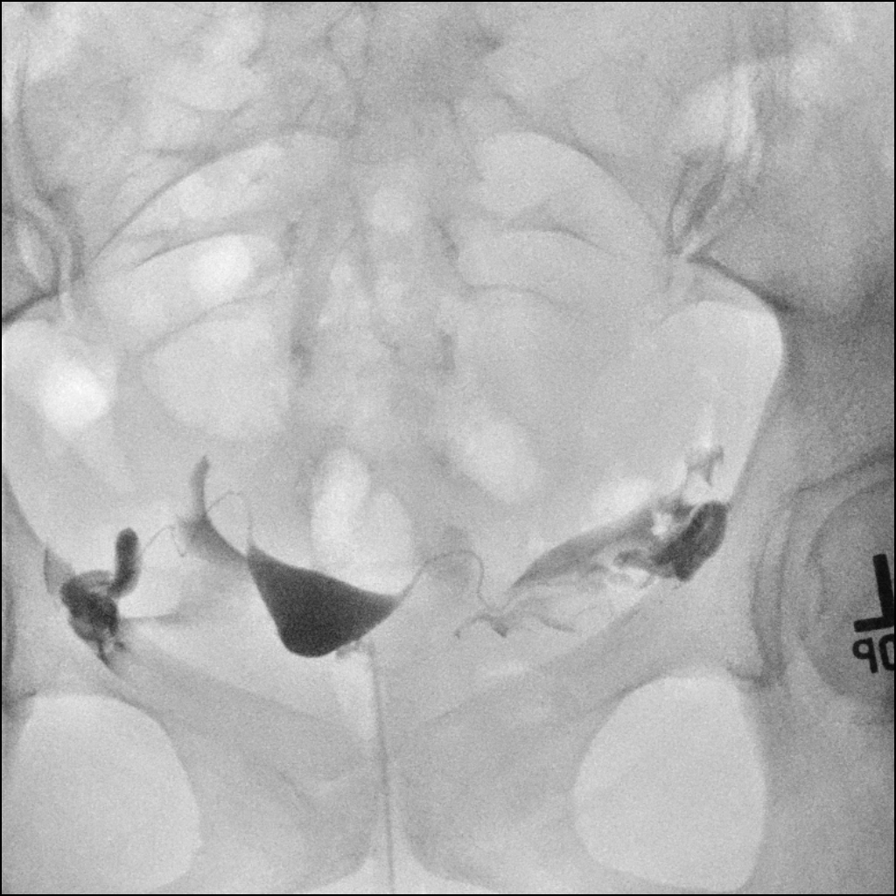
[im 6/6]
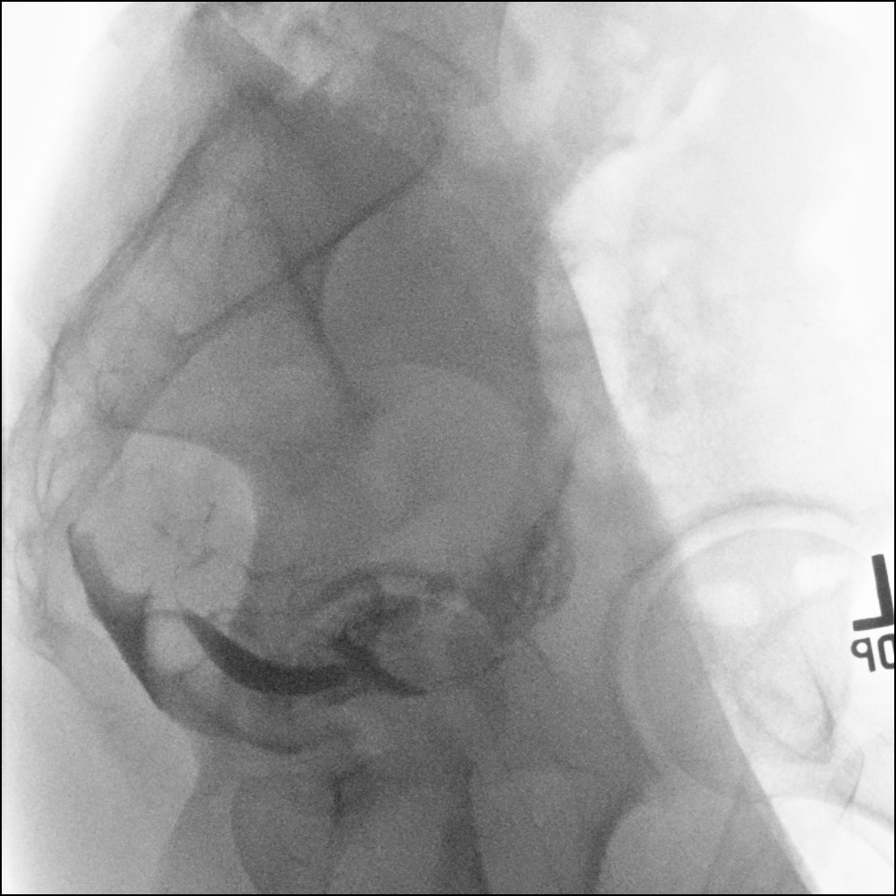

[6 of 6 positions shown; findings below may reference images not displayed]

FLUOROSCOPY:
Radiation Exposure Index (as provided by the fluoroscopic device):
14.2 mGy Kerma
FINDINGS: Uterus is retroflexed. The endometrial cavity is normal in
appearance and contour. No signs of mullerian duct anomaly.

Opacification of both fallopian tubes is seen. Both tubes appear
normal. Intraperitoneal spill of contrast from both fallopian tubes
is demonstrated.
IMPRESSION: Normal study. Both Fallopian tubes are patent.

## 2024-02-26 DIAGNOSIS — O0992 Supervision of high risk pregnancy, unspecified, second trimester: Secondary | ICD-10-CM

## 2024-03-21 ENCOUNTER — Encounter: Payer: Self-pay | Admitting: *Deleted

## 2024-03-21 DIAGNOSIS — Z9889 Other specified postprocedural states: Secondary | ICD-10-CM | POA: Insufficient documentation

## 2024-03-21 DIAGNOSIS — O09819 Supervision of pregnancy resulting from assisted reproductive technology, unspecified trimester: Secondary | ICD-10-CM | POA: Insufficient documentation

## 2024-03-27 ENCOUNTER — Other Ambulatory Visit

## 2024-03-27 ENCOUNTER — Ambulatory Visit

## 2024-04-04 ENCOUNTER — Ambulatory Visit

## 2024-04-04 ENCOUNTER — Other Ambulatory Visit
